# Patient Record
Sex: Male | Born: 1995 | Race: Black or African American | Hispanic: No | Marital: Single | Smoking: Never smoker
Health system: Southern US, Community
[De-identification: ages and names within clinical notes are randomized; demographics above are authoritative.]

## PROBLEM LIST (undated history)

## (undated) HISTORY — PX: OTHER SURGICAL HISTORY: SHX169

## (undated) HISTORY — PX: BRAIN SURGERY: SHX531

---
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
## 2017-03-28 ENCOUNTER — Encounter (HOSPITAL_COMMUNITY): Payer: Self-pay | Admitting: Emergency Medicine

## 2017-03-28 ENCOUNTER — Emergency Department (HOSPITAL_COMMUNITY)
Admission: EM | Admit: 2017-03-28 | Discharge: 2017-03-28 | Discharge status: Against Medical Advice | Payer: Self-pay | Attending: Emergency Medicine | Admitting: Emergency Medicine

## 2017-03-28 DIAGNOSIS — S0990XA Unspecified injury of head, initial encounter: Secondary | ICD-10-CM | POA: Insufficient documentation

## 2017-03-28 DIAGNOSIS — Z5321 Procedure and treatment not carried out due to patient leaving prior to being seen by health care provider: Secondary | ICD-10-CM | POA: Insufficient documentation

## 2017-03-28 DIAGNOSIS — Y939 Activity, unspecified: Secondary | ICD-10-CM | POA: Insufficient documentation

## 2017-03-28 DIAGNOSIS — Y999 Unspecified external cause status: Secondary | ICD-10-CM | POA: Insufficient documentation

## 2017-03-28 DIAGNOSIS — Y929 Unspecified place or not applicable: Secondary | ICD-10-CM | POA: Insufficient documentation

## 2017-03-28 NOTE — ED Triage Notes (Signed)
Pt c/o mouth pain. Pt's mouth is bleeding uncontrolled. Pt has lac to the left forehead. Pt unknown if there was any loc.Pt has retainer

## 2017-03-28 NOTE — ED Notes (Signed)
Pt's mother states that she is going somewhere else due to having to wait

## 2018-05-15 ENCOUNTER — Emergency Department (HOSPITAL_COMMUNITY)
Admission: EM | Admit: 2018-05-15 | Discharge: 2018-05-15 | Disposition: A | Discharge status: Home / Self Care | Payer: Self-pay | Attending: Emergency Medicine | Admitting: Emergency Medicine

## 2018-05-15 ENCOUNTER — Other Ambulatory Visit: Payer: Self-pay

## 2018-05-15 ENCOUNTER — Encounter (HOSPITAL_COMMUNITY): Payer: Self-pay | Admitting: Emergency Medicine

## 2018-05-15 DIAGNOSIS — L0201 Cutaneous abscess of face: Secondary | ICD-10-CM | POA: Insufficient documentation

## 2018-05-15 DIAGNOSIS — L0291 Cutaneous abscess, unspecified: Secondary | ICD-10-CM

## 2018-05-15 MED ORDER — SULFAMETHOXAZOLE-TRIMETHOPRIM 800-160 MG PO TABS: 1.0000 | ORAL_TABLET | Freq: Two times a day (BID) | ORAL | 0 refills | Status: AC

## 2018-05-15 MED ORDER — SULFAMETHOXAZOLE-TRIMETHOPRIM 800-160 MG PO TABS: 1.0000 | ORAL_TABLET | Freq: Two times a day (BID) | ORAL | 0 refills | Status: DC

## 2018-05-15 NOTE — ED Provider Notes (Signed)
[facility] EMERGENCY DEPARTMENT Provider Note   CSN: [id number] Arrival date & time: 05/15/18  1108     History   Chief Complaint Chief Complaint  Patient presents with  . Abscess    HPI Corey Lozano is a 22 y.o. male.  HPI   22 year old male with an abscess underneath the chin inferior.  First noticed a small "pimple" on Sunday.  Progressively getting bigger since then.  He has been squeezing it and has gotten some "pus" out of it.  He would be checked because it is not completely gotten better and is also concerned that it may potentially be a spider bite.  Denies any dental pain.  No fevers or chills.  History reviewed. No pertinent past medical history.  There are no active problems to display for this patient.   Past Surgical History:  Procedure Laterality Date  . BRAIN SURGERY          Home Medications    Prior to Admission medications   Medication Sig Start Date End Date Taking? Authorizing Provider  sulfamethoxazole-trimethoprim (BACTRIM DS,SEPTRA DS) 800-160 MG tablet Take 1 tablet by mouth 2 (two) times daily for 7 days. 05/15/18 05/22/18  Virgel Manifold, MD    Family History No family history on file.  Social History Social History   Tobacco Use  . Smoking status: Never Smoker  . Smokeless tobacco: Never Used  Substance Use Topics  . Alcohol use: No  . Drug use: No     Allergies   Patient has no known allergies.   Review of Systems Review of Systems  All systems reviewed and negative, other than as noted in HPI.  Physical Exam Updated Vital Signs BP 123/82 (BP Location: Right Arm)   Pulse 92   Temp 97.8 F (36.6 C) (Oral)   Resp 16   Ht 5\' 5"  (1.651 m)   Wt 54.4 kg   SpO2 100%   BMI 19.97 kg/m   Physical Exam  Constitutional: He appears well-developed and well-nourished. No distress.  HENT:  Head: Normocephalic.  Eyes: Conjunctivae are normal. Right eye exhibits no discharge. Left eye exhibits no discharge.  Neck:  Neck supple.    Just below the surgical scar underneath his chin there is a small ulcerated lesion less than size of a dime consistent with a healing abscess.  There is no fluctuance.  No drainage.  Minimal induration.  No cellulitis.  His dentition is intact.  He has no trismus.  Sublingual tissues are soft.  Normal sounding voice.  Cardiovascular: Normal rate, regular rhythm and normal heart sounds. Exam reveals no gallop and no friction rub.  No murmur heard. Pulmonary/Chest: Effort normal and breath sounds normal. No respiratory distress.  Abdominal: Soft. He exhibits no distension. There is no tenderness.  Musculoskeletal: He exhibits no edema or tenderness.  Neurological: He is alert.  Skin: Skin is warm and dry.  Psychiatric: He has a normal mood and affect. His behavior is normal. Thought content normal.  Nursing note and vitals reviewed.    ED Treatments / Results  Labs (all labs ordered are listed, but only abnormal results are displayed) Labs Reviewed - No data to display  EKG None  Radiology No results found.  Procedures Procedures (including critical care time)  Medications Ordered in ED Medications - No data to display   Initial Impression / Assessment and Plan / ED Course  I have reviewed the triage vital signs and the nursing notes.  Pertinent labs & imaging results  that were available during my care of the patient were reviewed by me and considered in my medical decision making (see chart for details).     22 year old male with a small abscess beneath his chin.  No organized collection at this time to I&D.  There is no stranding cellulitis.  I do not think that this is odontologic in origin. Plan warm compresses. Bactrim. Continued wound care and return precautions discussed.   Final Clinical Impressions(s) / ED Diagnoses   Final diagnoses:  Abscess    ED Discharge Orders         Ordered    sulfamethoxazole-trimethoprim (BACTRIM DS,SEPTRA DS)  800-160 MG tablet  2 times daily,   Status:  Discontinued     05/15/18 1204    sulfamethoxazole-trimethoprim (BACTRIM DS,SEPTRA DS) 800-160 MG tablet  2 times daily     05/15/18 1206           Virgel Manifold, MD 05/15/18 1215

## 2018-05-15 NOTE — ED Triage Notes (Signed)
Poss abscess under chin, has made pt's neck swell

## 2018-05-15 NOTE — Discharge Instructions (Signed)
Use warm compresses 5-10 minutes 3-4 times a day for the next few days. Otherwise try to minimize irritation to the area. Take 600 mg of ibuprofen every 6 hours as needed for pain.

## 2019-08-13 ENCOUNTER — Other Ambulatory Visit: Payer: Self-pay

## 2019-08-13 ENCOUNTER — Emergency Department (HOSPITAL_COMMUNITY): Payer: Self-pay

## 2019-08-13 ENCOUNTER — Encounter (HOSPITAL_COMMUNITY): Payer: Self-pay

## 2019-08-13 ENCOUNTER — Emergency Department (HOSPITAL_COMMUNITY)
Admission: EM | Admit: 2019-08-13 | Discharge: 2019-08-13 | Disposition: A | Discharge status: Home / Self Care | Payer: Self-pay | Attending: Emergency Medicine | Admitting: Emergency Medicine

## 2019-08-13 DIAGNOSIS — R6884 Jaw pain: Secondary | ICD-10-CM | POA: Insufficient documentation

## 2019-08-13 DIAGNOSIS — G8929 Other chronic pain: Secondary | ICD-10-CM | POA: Insufficient documentation

## 2019-08-13 MED ORDER — IBUPROFEN 800 MG PO TABS: 800.0000 mg | ORAL_TABLET | Freq: Three times a day (TID) | ORAL | 0 refills | Status: AC

## 2019-08-13 MED ORDER — IBUPROFEN 800 MG PO TABS
800.0000 mg | ORAL_TABLET | Freq: Once | ORAL | Status: AC
  Administered 2019-08-13: 800 mg via ORAL
  Filled 2019-08-13: qty 1

## 2019-08-13 NOTE — Discharge Instructions (Addendum)
Your xrays today are normal with no obvious reason for your pain.  I recommend increasing your ibuprofen (advil) to 4 tablets every 8 hours if needed for pain.  This is the maximum safe dose to take but should help with your symptoms.  The group listed below is who did your surgery.  Call for a recheck if your symptoms persist.   Plastic & Reconstructive Surgery - [facility] [place], [place] [address]   [phone]

## 2019-08-13 NOTE — ED Provider Notes (Signed)
[facility] EMERGENCY DEPARTMENT Provider Note   CSN: [id number] Arrival date & time: 08/13/19  [id number]     History   Chief Complaint Chief Complaint  Patient presents with  . Jaw Pain    HPI Corey Lozano is a 23 y.o. male with a history of jaw surgery secondary to fracture in 2018 [facility]) presenting with right jaw joint pain which he experienced last winter as well when the weather gets cold.  He describes chronic aching pain which worsens with chewing, movement and simply talking. He denies fevers, chills, swelling, denies any dental trauma, decay or pain and no ear pain or reduced hearing acuity.  He has taken ibuprofen 200 mg with no improvement in pain.  He does not an occasional clicking sound with chewing.  He does not wake with tooth or jaw pain.     The history is provided by the patient.    History reviewed. No pertinent past medical history.  There are no active problems to display for this patient.   Past Surgical History:  Procedure Laterality Date  . BRAIN SURGERY    . closed fracture of the symphysis of the mandible    . jaw sx          Home Medications    Prior to Admission medications   Medication Sig Start Date End Date Taking? Authorizing Provider  ibuprofen (ADVIL) 800 MG tablet Take 1 tablet (800 mg total) by mouth 3 (three) times daily. 08/13/19   Evalee Jefferson, PA-C    Family History No family history on file.  Social History Social History   Tobacco Use  . Smoking status: Never Smoker  . Smokeless tobacco: Never Used  Substance Use Topics  . Alcohol use: No    Comment: occ  . Drug use: Yes    Types: Marijuana     Allergies   Patient has no known allergies.   Review of Systems Review of Systems  Constitutional: Negative for chills and fever.  HENT: Negative for dental problem, ear pain, facial swelling, sinus pain, sore throat and trouble swallowing.        Negative except as mentioned in HPI.  Respiratory: Negative for  shortness of breath.   Cardiovascular: Negative for chest pain.  Gastrointestinal: Negative.   Musculoskeletal: Negative for neck pain and neck stiffness.     Physical Exam Updated Vital Signs BP 119/70 (BP Location: Right Arm)   Pulse 71   Temp 98.6 F (37 C) (Oral)   Resp 16   SpO2 100%   Physical Exam Vitals signs and nursing note reviewed.  Constitutional:      Appearance: He is well-developed.  HENT:     Head: Normocephalic and atraumatic.     Jaw: There is normal jaw occlusion. Tenderness present. No trismus or swelling.      Mouth/Throat:     Mouth: Mucous membranes are moist.     Dentition: Normal dentition. No dental tenderness or gingival swelling.  Eyes:     Conjunctiva/sclera: Conjunctivae normal.  Neck:     Musculoskeletal: Normal range of motion.  Cardiovascular:     Rate and Rhythm: Normal rate and regular rhythm.  Pulmonary:     Effort: Pulmonary effort is normal.  Musculoskeletal: Normal range of motion.  Lymphadenopathy:     Comments: No head or neck adenopathy  Skin:    General: Skin is warm and dry.  Neurological:     Mental Status: He is alert.      ED  Treatments / Results  Labs (all labs ordered are listed, but only abnormal results are displayed) Labs Reviewed - No data to display  EKG None  Radiology Dg Mandible 4 Views  Result Date: 08/13/2019 CLINICAL DATA:  Trauma to the mandible 1 year ago, status postoperative fixation with increasing pain. EXAM: MANDIBLE - 4+ VIEW COMPARISON:  None. FINDINGS: Postoperative changes related to ORIF of the mandible along the anterior mandible without acute findings. IMPRESSION: Postoperative changes of plate and screw fixation of the mandible without acute findings. Electronically Signed   By: Zetta Bills M.D.   On: 08/13/2019 11:25    Procedures Procedures (including critical care time)  Medications Ordered in ED Medications  ibuprofen (ADVIL) tablet 800 mg (800 mg Oral Given 08/13/19  1240)     Initial Impression / Assessment and Plan / ED Course  I have reviewed the triage vital signs and the nursing notes.  Pertinent labs & imaging results that were available during my care of the patient were reviewed by me and considered in my medical decision making (see chart for details).        Imaging reviewed and discussed with patient.  No arthritis noted on his plain film images.  Dentition is without pain, decay, no gingival edema.  No appreciable crepitus with TM range of motion.  He was encouraged to follow-up with his surgeon at [facility] for further evaluation if his symptoms persist.  He was prescribed prescription strength ibuprofen for hopeful better pain relief.  As needed follow-up anticipated.  Final Clinical Impressions(s) / ED Diagnoses   Final diagnoses:  Chronic jaw pain    ED Discharge Orders         Ordered    ibuprofen (ADVIL) 800 MG tablet  3 times daily     08/13/19 1219           Evalee Jefferson, PA-C 08/13/19 1300    Milton Ferguson, MD 08/13/19 1430

## 2019-08-13 NOTE — ED Triage Notes (Signed)
Pt reports hx of fx jaw on right side last year and since cold weather increase pain and feels like it is pushing into head

## 2019-12-13 ENCOUNTER — Emergency Department (HOSPITAL_COMMUNITY): Payer: Self-pay

## 2019-12-13 ENCOUNTER — Emergency Department (HOSPITAL_COMMUNITY)
Admission: EM | Admit: 2019-12-13 | Discharge: 2019-12-14 | Disposition: A | Discharge status: Home / Self Care | Payer: Self-pay | Attending: Emergency Medicine | Admitting: Emergency Medicine

## 2019-12-13 ENCOUNTER — Other Ambulatory Visit: Payer: Self-pay

## 2019-12-13 DIAGNOSIS — F1210 Cannabis abuse, uncomplicated: Secondary | ICD-10-CM | POA: Insufficient documentation

## 2019-12-13 DIAGNOSIS — M79605 Pain in left leg: Secondary | ICD-10-CM | POA: Insufficient documentation

## 2019-12-13 DIAGNOSIS — D1622 Benign neoplasm of long bones of left lower limb: Secondary | ICD-10-CM | POA: Insufficient documentation

## 2019-12-13 MED ORDER — NAPROXEN 500 MG PO TABS: 500.0000 mg | ORAL_TABLET | Freq: Two times a day (BID) | ORAL | 0 refills | Status: AC | PRN

## 2019-12-13 NOTE — ED Provider Notes (Addendum)
[facility] EMERGENCY DEPARTMENT Provider Note   CSN: [id number] Arrival date & time: 12/13/19  2228     History Chief Complaint  Patient presents with  . Leg Pain    left    Corey Lozano is a 24 y.o. male.  Patient states having intermittent pain in his left knee and thigh for many years.  He states the pain comes and goes happening 2-3 times a week usually waking him from sleep.  He believes he had a broken bone in his femur when he was a child and was in a cast but is not certain if he had surgery.  He denies any recent fall or trauma.  States he had pain wake him from sleep tonight and had difficulty bending his knee so he wanted to get checked out.  He did not take any medication at home.  The pain is improved.  The pain is on his left lateral thigh and knee.  He is able to range his ankle and knee and hip without pain.  Denies any fevers, chills, nausea or vomiting.  Denies any leg swelling.  Denies any chest pain or shortness of breath. He has never had this pain evaluated in the past.  States his pain happens when he stands for long period of time and happens several times a week but usually goes away after a few hours.  States when the pain comes he ties a sock around his leg and it helps the pain. He was advised not to do this.  The history is provided by the patient.  Leg Pain Associated symptoms: no fever        No past medical history on file.  There are no problems to display for this patient.   Past Surgical History:  Procedure Laterality Date  . BRAIN SURGERY    . closed fracture of the symphysis of the mandible    . jaw sx         No family history on file.  Social History   Tobacco Use  . Smoking status: Never Smoker  . Smokeless tobacco: Never Used  Substance Use Topics  . Alcohol use: No    Comment: occ  . Drug use: Yes    Types: Marijuana    Home Medications Prior to Admission medications   Medication Sig Start Date End Date Taking?  Authorizing Provider  ibuprofen (ADVIL) 800 MG tablet Take 1 tablet (800 mg total) by mouth 3 (three) times daily. 08/13/19   Evalee Jefferson, PA-C    Allergies    Patient has no known allergies.  Review of Systems   Review of Systems  Constitutional: Negative for activity change, appetite change and fever.  HENT: Negative for congestion and rhinorrhea.   Eyes: Negative for visual disturbance.  Respiratory: Negative for cough, chest tightness and shortness of breath.   Cardiovascular: Negative for chest pain.  Gastrointestinal: Negative for abdominal pain, nausea and vomiting.  Genitourinary: Negative for dysuria and hematuria.  Musculoskeletal: Positive for arthralgias and myalgias.  Skin: Negative for rash.  Neurological: Negative for weakness and headaches.   all other systems are negative except as noted in the HPI and PMH.    Physical Exam Updated Vital Signs BP 112/72 (BP Location: Left Arm)   Pulse 76   Temp 98.8 F (37.1 C) (Oral)   Resp 18   Ht 5\' 5"  (1.651 m)   Wt 52.2 kg   SpO2 95%   BMI 19.14 kg/m   Physical Exam  Vitals and nursing note reviewed.  Constitutional:      General: He is not in acute distress.    Appearance: He is well-developed.  HENT:     Head: Normocephalic and atraumatic.     Mouth/Throat:     Pharynx: No oropharyngeal exudate.  Eyes:     Conjunctiva/sclera: Conjunctivae normal.     Pupils: Pupils are equal, round, and reactive to light.  Neck:     Comments: No meningismus. Cardiovascular:     Rate and Rhythm: Normal rate and regular rhythm.     Heart sounds: Normal heart sounds. No murmur.  Pulmonary:     Effort: Pulmonary effort is normal. No respiratory distress.     Breath sounds: Normal breath sounds.  Abdominal:     Palpations: Abdomen is soft.     Tenderness: There is no abdominal tenderness. There is no guarding or rebound.  Musculoskeletal:        General: No swelling, tenderness, deformity or signs of injury. Normal range of  motion.     Cervical back: Normal range of motion and neck supple.     Comments: Left leg is normal to inspection.  There is tenderness to the lateral distal thigh.  Full range of motion of knee joints.  No effusion.  Able to lift leg and keep knee extended. Intact DP and PT pulse.  Compartments soft.  No asymmetry. No edema.  Skin:    General: Skin is warm.  Neurological:     Mental Status: He is alert and oriented to person, place, and time.     Cranial Nerves: No cranial nerve deficit.     Motor: No abnormal muscle tone.     Coordination: Coordination normal.     Comments: No ataxia on finger to nose bilaterally. No pronator drift. 5/5 strength throughout. CN 2-12 intact.Equal grip strength. Sensation intact.   Psychiatric:        Behavior: Behavior normal.     ED Results / Procedures / Treatments   Labs (all labs ordered are listed, but only abnormal results are displayed) Labs Reviewed - No data to display  EKG None  Radiology DG Knee Complete 4 Views Left  Result Date: 12/13/2019 CLINICAL DATA:  Left leg pain EXAM: LEFT KNEE - COMPLETE 4+ VIEW COMPARISON:  None. FINDINGS: No acute bony abnormality. Specifically, no fracture, subluxation, or dislocation. No joint effusion. Joint spaces maintained. Osteochondroma off the distal lateral femur. IMPRESSION: No acute bony abnormality. Electronically Signed   By: Rolm Baptise M.D.   On: 12/13/2019 23:47   DG Hip Unilat W or Wo Pelvis 2-3 Views Left  Result Date: 12/13/2019 CLINICAL DATA:  Left leg pain EXAM: DG HIP (WITH OR WITHOUT PELVIS) 2-3V LEFT COMPARISON:  None. FINDINGS: There is no evidence of hip fracture or dislocation. There is no evidence of arthropathy or other focal bone abnormality. IMPRESSION: Negative. Electronically Signed   By: Rolm Baptise M.D.   On: 12/13/2019 23:47    Procedures Procedures (including critical care time)  Medications Ordered in ED Medications - No data to display  ED Course  I have  reviewed the triage vital signs and the nursing notes.  Pertinent labs & imaging results that were available during my care of the patient were reviewed by me and considered in my medical decision making (see chart for details).    MDM Rules/Calculators/A&P                     Intermittent  leg pain with possible history of remote fracture.  Neurovascularly intact. No evidence of infection.  No asymmetry or calf tenderness to suggest DVT.  Obtain x-ray to evaluate for potential hardware malfunction.  Doubt DVT. X-rays negative for fracture or dislocation. No evidence of previous surgery.  Does show osteochondroma of distal femur which correlates with patient's area of pain.  Advised ice, NSAIDs, orthopedic follow-up.  Return precautions discussed. Final Clinical Impression(s) / ED Diagnoses Final diagnoses:  Left leg pain  Osteochondroma of femur, left    Rx / DC Orders ED Discharge Orders    None       Brandee Markin, Annie Main, MD 12/13/19 Minus Breeding    Ezequiel Essex, MD 12/14/19 0006

## 2019-12-13 NOTE — ED Triage Notes (Signed)
Patient states he is having left leg pain in the thigh area. Patient denies any recent injury. Patient states that he had a broken left femur when he was a child and sometimes he has pain in the left leg.

## 2019-12-13 NOTE — Discharge Instructions (Signed)
Your x-ray shows no fracture or evidence of previous surgery.  It does show a growth of the bone called an osteochondroma which is usually benign but you should follow-up with the orthopedic doctor for further assessment of this lesion.  You may use ice and anti-inflammatories.  Return to the ED with worsening pain, numbness, tingling, or other concerns.

## 2019-12-30 ENCOUNTER — Ambulatory Visit (INDEPENDENT_AMBULATORY_CARE_PROVIDER_SITE_OTHER): Payer: Self-pay | Admitting: Orthopedic Surgery

## 2019-12-30 ENCOUNTER — Other Ambulatory Visit: Payer: Self-pay

## 2019-12-30 ENCOUNTER — Encounter: Payer: Self-pay | Admitting: Orthopedic Surgery

## 2019-12-30 VITALS — BP 111/70 | HR 68 | Temp 98.1°F | Ht 65.0 in | Wt 115.0 lb

## 2019-12-30 DIAGNOSIS — M899 Disorder of bone, unspecified: Secondary | ICD-10-CM

## 2019-12-30 DIAGNOSIS — D1622 Benign neoplasm of long bones of left lower limb: Secondary | ICD-10-CM

## 2019-12-30 NOTE — Progress Notes (Signed)
Chief Complaint  Patient presents with  . Leg Pain    Left leg pain.    24 year old male went to the ER because of longstanding left knee pain and aching x-ray showed a possible osteochondroma.  He presents here for management  He says he had trouble with the leg off-and-on with aching pain he says now it locks and clicks  He did have a femur fracture as a child however x-rays of the hip and femur and knee show that the femur fracture healed nicely  Review of systems is completely negative no fever no chills no masses no weight loss  No past medical history on file. Past Surgical History:  Procedure Laterality Date  . BRAIN SURGERY    . closed fracture of the symphysis of the mandible    . jaw sx     Social History   Tobacco Use  . Smoking status: Never Smoker  . Smokeless tobacco: Never Used  Substance Use Topics  . Alcohol use: No    Comment: occ  . Drug use: Yes    Types: Marijuana   BP 111/70   Pulse 68   Temp 98.1 F (36.7 C)   Ht 5\' 5"  (1.651 m)   Wt 115 lb (52.2 kg)   BMI 19.14 kg/m   Well-developed well-nourished male neurovascular intact gait is normal oriented x3 small frame ectomorphic body habitus  Upper extremities are normal  Right lower extremity has a palpable mass lateral side of his lower leg above the knee joint does not affect his range of motion and stability and strength there is no atrophy skin is normal pulses are good lymph nodes are negative sensation is intact no pathologic reflexes coordination and balance are normal  X-ray from the hospital.  Hip and pelvis including the left show no fracture dislocation or bone lesion.  Looks like his femur healed nicely  His knee x-ray looks normal except for old spicule shaped mass with connection to the intramedullary canal several centimeters above the joint line which may be an osteochondroma  Recommend CAT scan to evaluate.  If the CAT scan shows a true osteochondroma we can remove it if not  something more ominous form to them that we will send to cancer specialist  Encounter Diagnoses  Name Primary?  . Lytic bone lesion of left femur Yes  . Osteochondroma of femur, left

## 2019-12-30 NOTE — Patient Instructions (Signed)
CT scan will be ordered.  He will come back here for follow-up visit to review it and possibly schedule surgery

## 2020-01-21 ENCOUNTER — Ambulatory Visit (HOSPITAL_COMMUNITY): Admission: RE | Admit: 2020-01-21 | Payer: Medicaid Other | Source: Ambulatory Visit

## 2020-02-03 ENCOUNTER — Ambulatory Visit (HOSPITAL_COMMUNITY): Admission: RE | Admit: 2020-02-03 | Payer: Self-pay | Source: Ambulatory Visit

## 2020-02-04 ENCOUNTER — Telehealth: Payer: Self-pay | Admitting: Radiology

## 2020-02-04 NOTE — Telephone Encounter (Signed)
Patient no showed for his CT scan yesterday, I called him he states there was a death in the family, I have given him the number to Rs and urged him to get the scan, since he has a bone lesion, he said he is not having any pain, I told him he really needs scan to find out what this is, he has voiced understanding and will call to [facility]

## 2020-02-09 ENCOUNTER — Telehealth: Payer: Self-pay | Admitting: Radiology

## 2020-02-09 ENCOUNTER — Encounter: Payer: Self-pay | Admitting: Orthopedic Surgery

## 2020-02-09 NOTE — Telephone Encounter (Signed)
Tell Corey Lozano to send him a certified letter that says the following:  You have a lesion in the bone of your thigh.  We are not sure what this lesion is.  It could be cancerous.  We strongly urged that you schedule an MRI to diagnose the lesion.

## 2020-02-09 NOTE — Telephone Encounter (Signed)
Patient no showed for MRI, I called him he states there was a death in the family, I have given him the number to Rs and urged him to get the scan, since he has a bone lesion, he said he is not having any pain, I told him he really needs scan to find out what this is, he has voiced understanding and will call to RS   Patient has declined to call and RS you were concerned about a lesion in his femur that showed up on xray  I will close the MRI order, since he has declined the study, but want you to be aware

## 2020-02-09 NOTE — Telephone Encounter (Signed)
Letter has been done per Dr Aline Brochure. Routing to clinic supervisor for certified mailing.

## 2020-02-10 NOTE — Telephone Encounter (Signed)
Printed, will send via certified mail to patient.

## 2020-02-11 NOTE — Telephone Encounter (Signed)
Letter sent via certified mail today.

## 2020-03-03 ENCOUNTER — Encounter (HOSPITAL_COMMUNITY): Payer: Self-pay | Admitting: Emergency Medicine

## 2020-03-03 ENCOUNTER — Emergency Department (HOSPITAL_COMMUNITY)
Admission: EM | Admit: 2020-03-03 | Discharge: 2020-03-03 | Disposition: A | Discharge status: Home / Self Care | Payer: Medicaid Other | Attending: Emergency Medicine | Admitting: Emergency Medicine

## 2020-03-03 DIAGNOSIS — R531 Weakness: Secondary | ICD-10-CM | POA: Insufficient documentation

## 2020-03-03 DIAGNOSIS — Z5321 Procedure and treatment not carried out due to patient leaving prior to being seen by health care provider: Secondary | ICD-10-CM | POA: Insufficient documentation

## 2020-03-03 DIAGNOSIS — R0789 Other chest pain: Secondary | ICD-10-CM | POA: Insufficient documentation

## 2020-03-03 DIAGNOSIS — R55 Syncope and collapse: Secondary | ICD-10-CM | POA: Insufficient documentation

## 2020-03-03 LAB — CBC
HCT: 42.4 % (ref 39.0–52.0)
Hemoglobin: 14.8 g/dL (ref 13.0–17.0)
MCH: 30.1 pg (ref 26.0–34.0)
MCHC: 34.9 g/dL (ref 30.0–36.0)
MCV: 86.4 fL (ref 80.0–100.0)
Platelets: 212 10*3/uL (ref 150–400)
RBC: 4.91 MIL/uL (ref 4.22–5.81)
RDW: 11.3 % — ABNORMAL LOW (ref 11.5–15.5)
WBC: 4.9 10*3/uL (ref 4.0–10.5)
nRBC: 0 % (ref 0.0–0.2)

## 2020-03-03 LAB — BASIC METABOLIC PANEL
Anion gap: 11 (ref 5–15)
BUN: 7 mg/dL (ref 6–20)
CO2: 24 mmol/L (ref 22–32)
Calcium: 9.5 mg/dL (ref 8.9–10.3)
Chloride: 102 mmol/L (ref 98–111)
Creatinine, Ser: 1.06 mg/dL (ref 0.61–1.24)
GFR calc Af Amer: >60 mL/min (ref >60)
GFR calc non Af Amer: >60 mL/min (ref >60)
Glucose, Bld: 87 mg/dL (ref 70–99)
Potassium: 3.4 mmol/L — ABNORMAL LOW (ref 3.5–5.1)
Sodium: 137 mmol/L (ref 135–145)

## 2020-03-03 MED ORDER — SODIUM CHLORIDE 0.9% FLUSH: 3.0000 mL | Freq: Once | INTRAVENOUS | Status: DC

## 2020-03-03 NOTE — ED Triage Notes (Signed)
Pt reports while at work today started feeling generalized weakness with near syncope when standing from sitting position. Pt reports intermittent L sided chest pain with movement.

## 2020-03-03 NOTE — ED Notes (Signed)
Registration made this NT aware that pt left.

## 2020-05-16 IMAGING — DX DG MANDIBLE 4+V
4 series · 4 of 4 positions shown · non-contrast
Comparison: None.

CLINICAL DATA: Trauma to the mandible 1 year ago, status
postoperative fixation with increasing pain.

EXAM:
MANDIBLE - 4+ VIEW

[mandible pa]
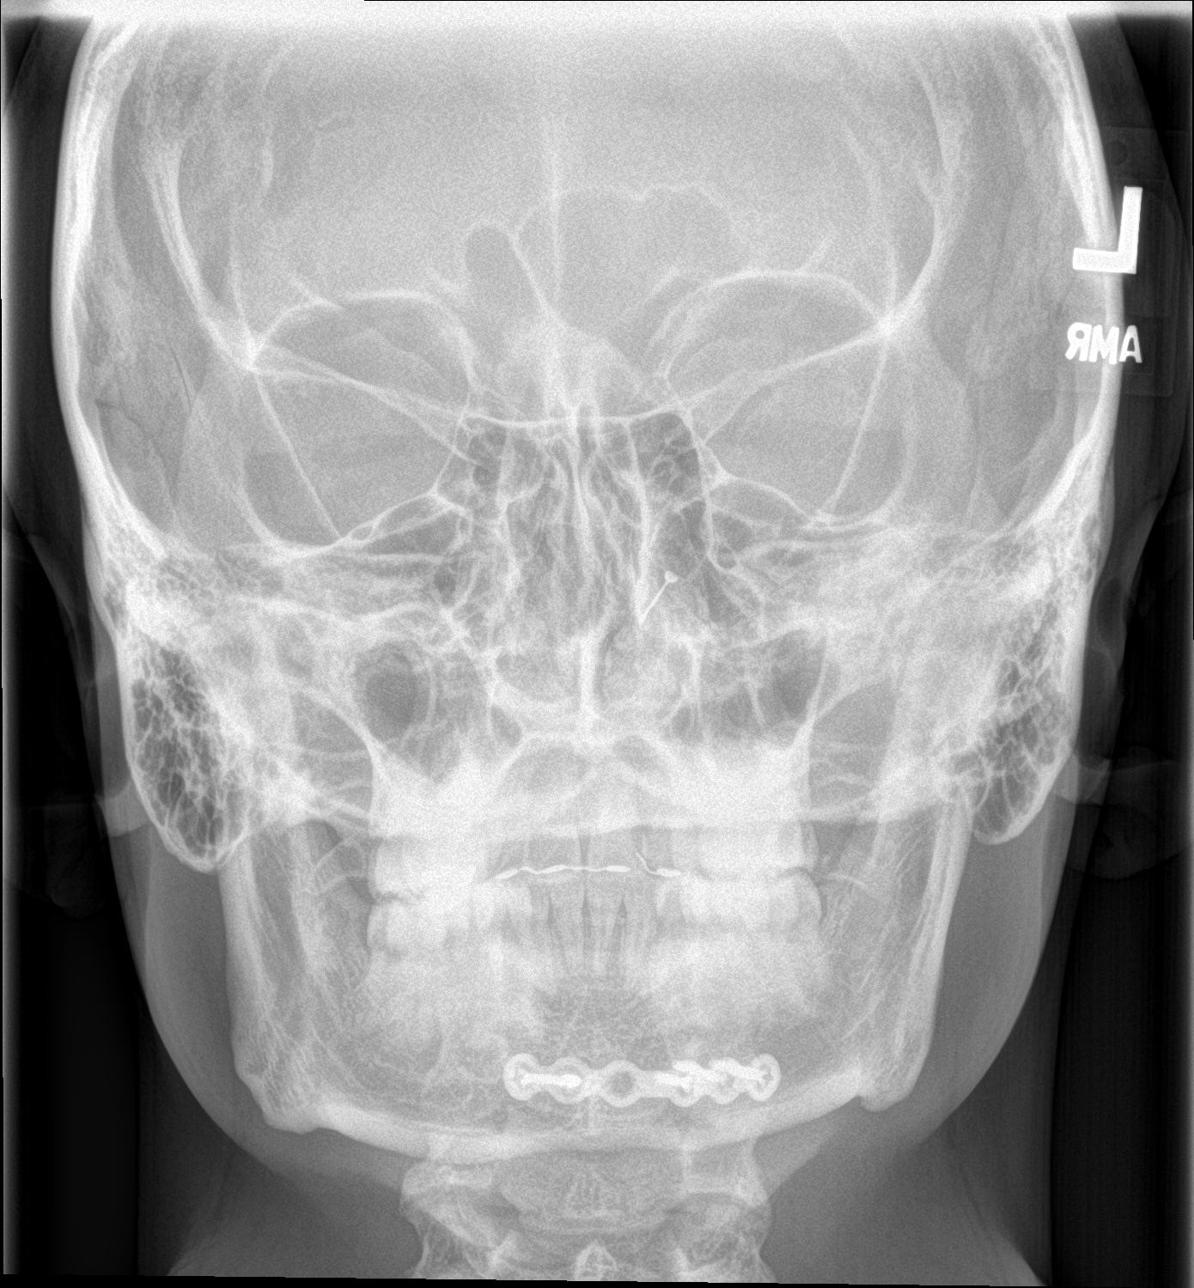

[mandible townes]
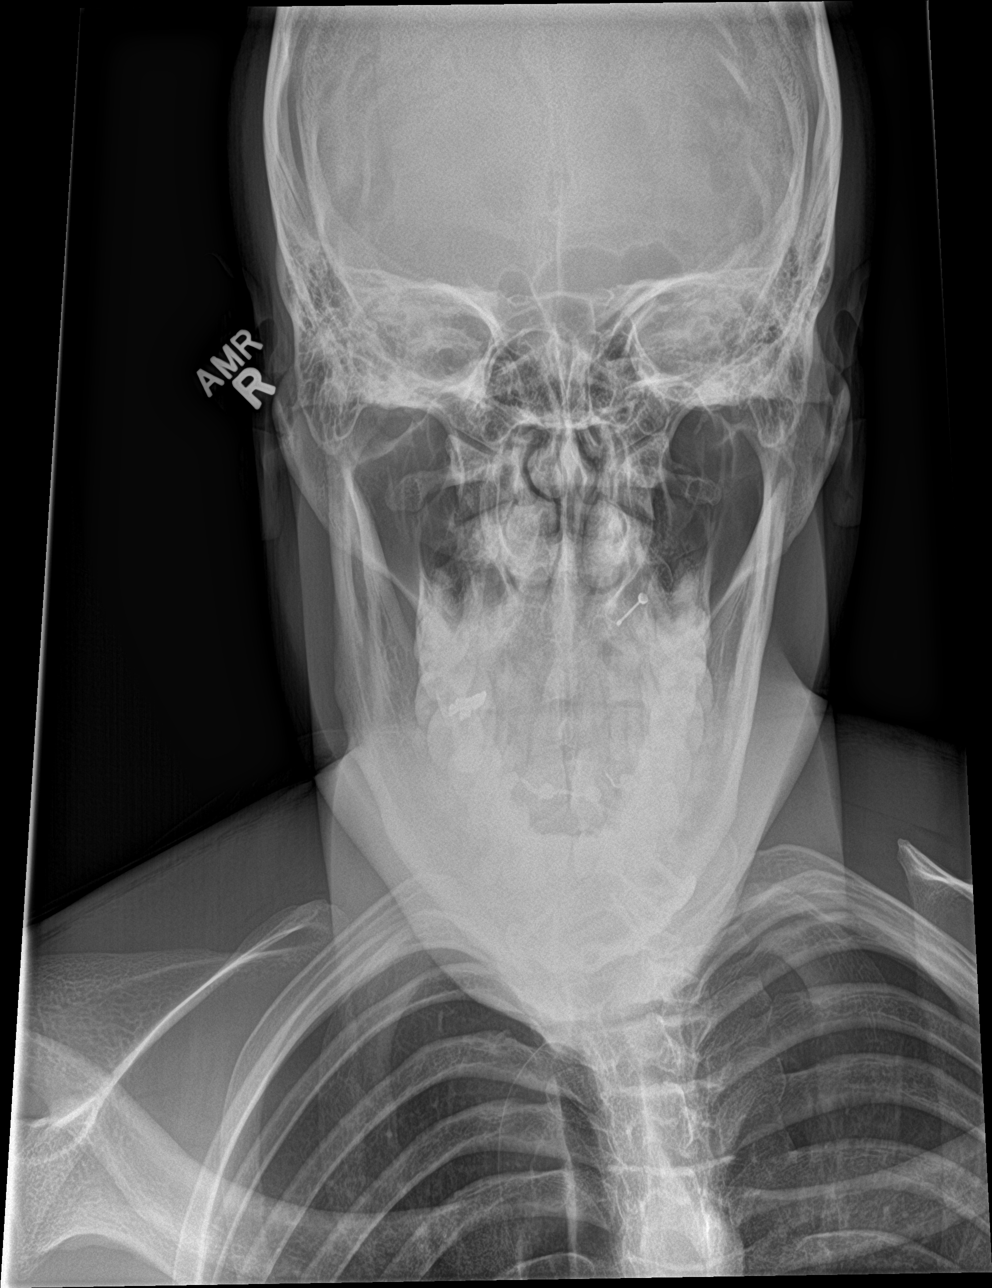

[mandible lat (1 of 2)]
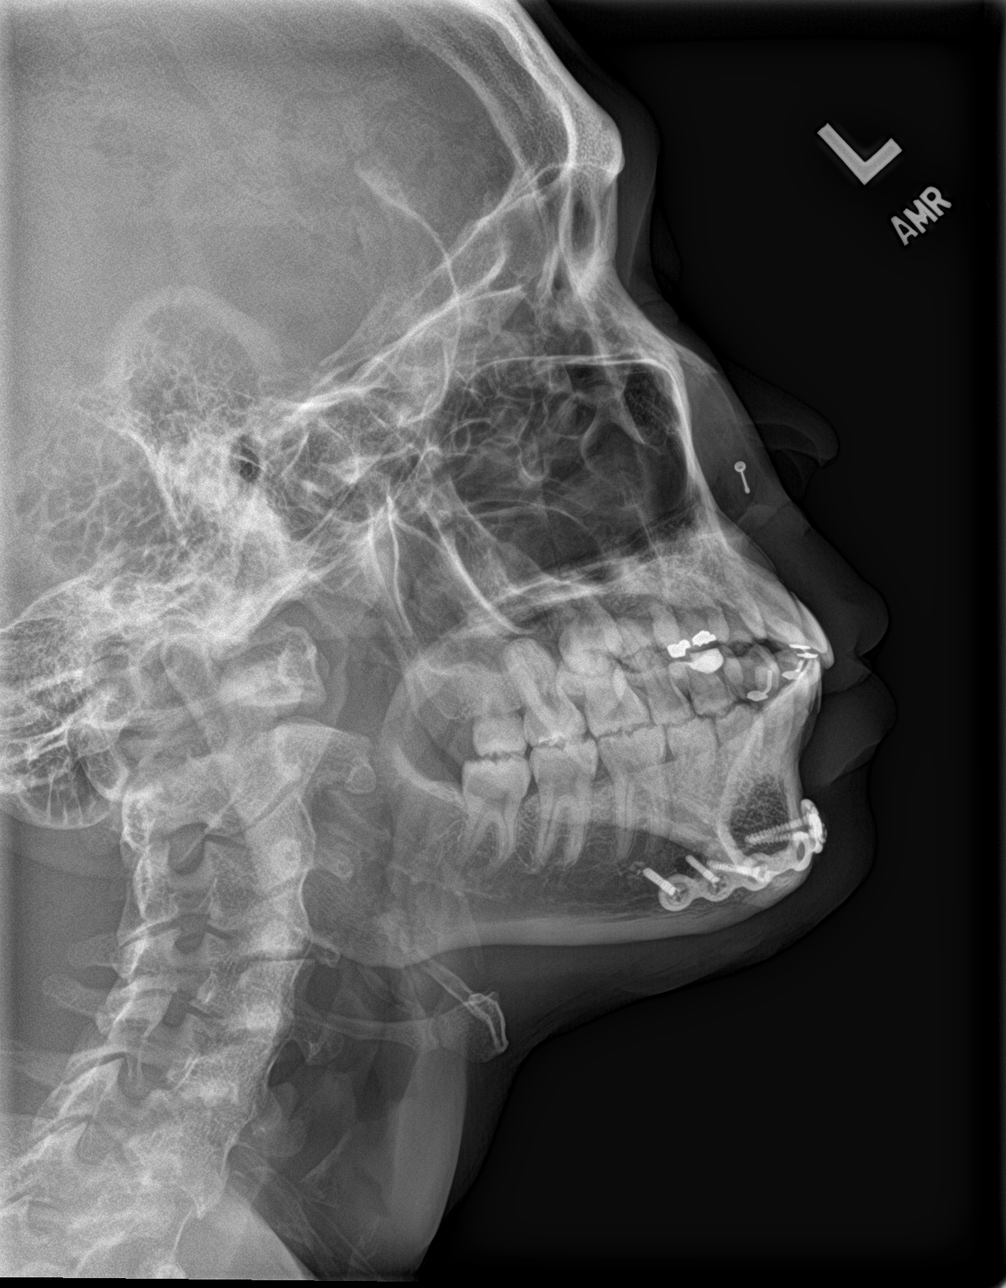

[mandible lat (2 of 2)]
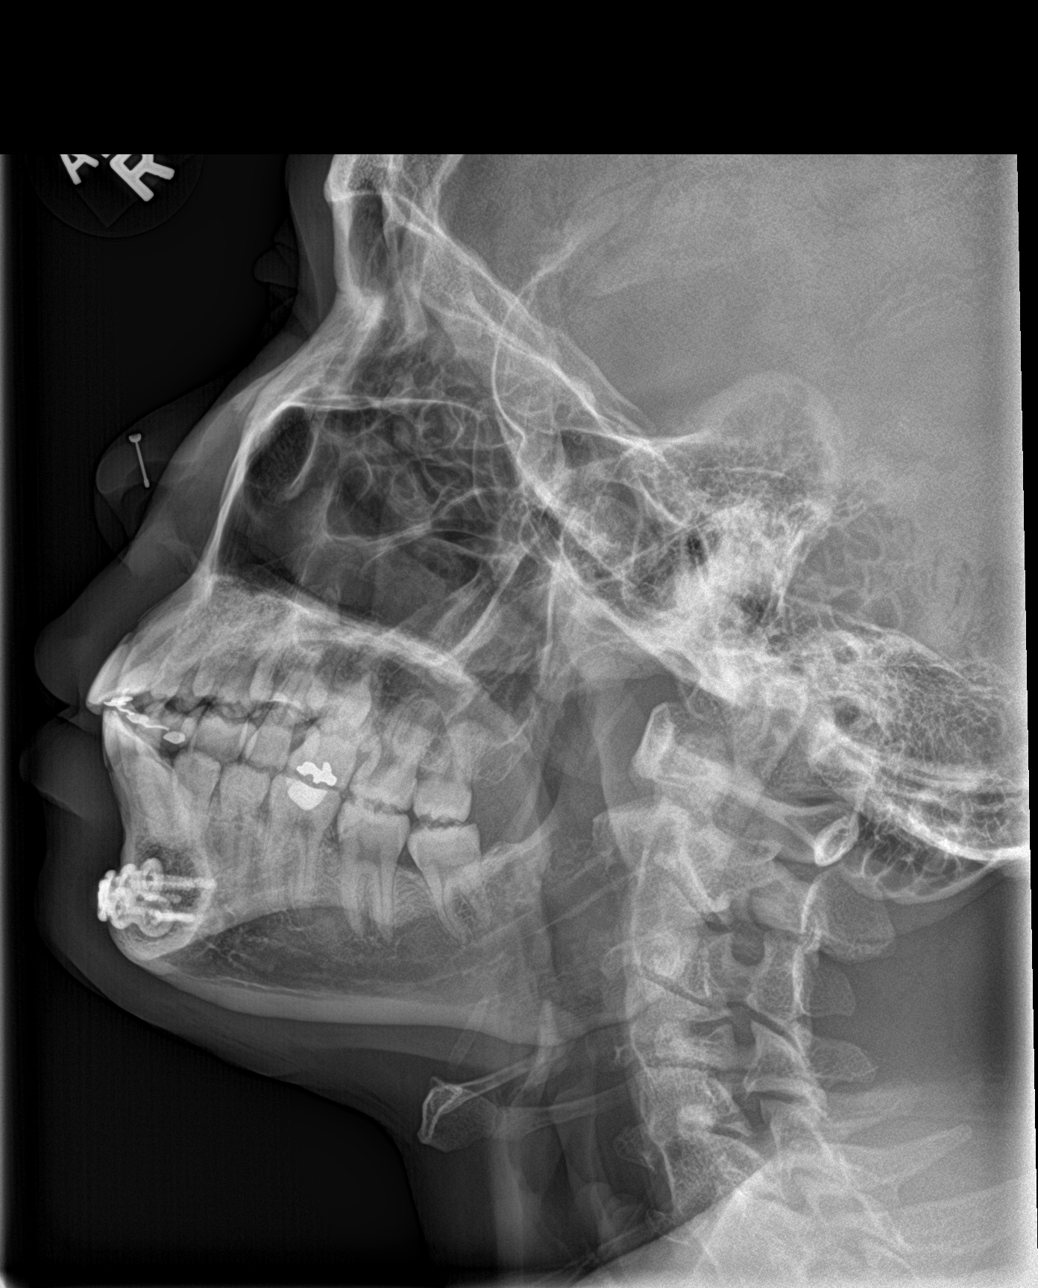

[4 of 4 positions shown; findings below may reference images not displayed]

FINDINGS: Postoperative changes related to ORIF of the mandible along the
anterior mandible without acute findings.
IMPRESSION: Postoperative changes of plate and screw fixation of the mandible
without acute findings.

## 2022-11-07 ENCOUNTER — Emergency Department (HOSPITAL_COMMUNITY)
Admission: EM | Admit: 2022-11-07 | Discharge: 2022-11-07 | Disposition: A | Discharge status: Home / Self Care | Payer: Self-pay | Attending: Emergency Medicine | Admitting: Emergency Medicine

## 2022-11-07 ENCOUNTER — Emergency Department (HOSPITAL_COMMUNITY): Payer: Self-pay

## 2022-11-07 DIAGNOSIS — H538 Other visual disturbances: Secondary | ICD-10-CM | POA: Insufficient documentation

## 2022-11-07 LAB — CBC WITH DIFFERENTIAL/PLATELET
Abs Immature Granulocytes: 0.02 10*3/uL (ref 0.00–0.07)
Basophils Absolute: 0 10*3/uL (ref 0.0–0.1)
Basophils Relative: 0 %
Eosinophils Absolute: 0.1 10*3/uL (ref 0.0–0.5)
Eosinophils Relative: 1 %
HCT: 40.3 % (ref 39.0–52.0)
Hemoglobin: 13.7 g/dL (ref 13.0–17.0)
Immature Granulocytes: 0 %
Lymphocytes Relative: 15 %
Lymphs Abs: 1.2 10*3/uL (ref 0.7–4.0)
MCH: 29.9 pg (ref 26.0–34.0)
MCHC: 34 g/dL (ref 30.0–36.0)
MCV: 88 fL (ref 80.0–100.0)
Monocytes Absolute: 0.3 10*3/uL (ref 0.1–1.0)
Monocytes Relative: 3 %
Neutro Abs: 6.2 10*3/uL (ref 1.7–7.7)
Neutrophils Relative %: 81 %
Platelets: 229 10*3/uL (ref 150–400)
RBC: 4.58 MIL/uL (ref 4.22–5.81)
RDW: 11.6 % (ref 11.5–15.5)
WBC: 7.7 10*3/uL (ref 4.0–10.5)
nRBC: 0 % (ref 0.0–0.2)

## 2022-11-07 LAB — BASIC METABOLIC PANEL
Anion gap: 9 (ref 5–15)
BUN: 11 mg/dL (ref 6–20)
CO2: 25 mmol/L (ref 22–32)
Calcium: 9.4 mg/dL (ref 8.9–10.3)
Chloride: 102 mmol/L (ref 98–111)
Creatinine, Ser: 0.79 mg/dL (ref 0.61–1.24)
GFR, Estimated: >60 mL/min (ref >60)
Glucose, Bld: 94 mg/dL (ref 70–99)
Potassium: 3.7 mmol/L (ref 3.5–5.1)
Sodium: 136 mmol/L (ref 135–145)

## 2022-11-07 NOTE — ED Notes (Signed)
Pt states that he was at work went vision went blurry for 45 minutes.  Pt stated that at that time he had a headache that started in the right side of his face then shifted to the left side.  Pt states that he currently has no headache and no blurry vision.  Pt reports he has never worn glasses but did have his "jaw broken 4 years ago."

## 2022-11-07 NOTE — ED Provider Triage Note (Signed)
Emergency Medicine Provider Triage Evaluation Note  Ivaan Silerio , a 27 y.o. male  was evaluated in triage.  Pt complains of bilateral blurry vision at 8 AM this morning, lasted for 45 minutes and resolved.  Associated now with a left-sided headache but no continued diplopia or blurry vision.  Denies any lateralized weakness..  Review of Systems  Per hpi   Physical Exam  BP 127/70   Pulse 79   Temp 98.2 F (36.8 C)   Resp 20   SpO2 99%  Gen:   Awake, no distress   Resp:  Normal effort  MSK:   Moves extremities without difficulty  Other:  Cranial nerves II through gross intact upper and lower extremity strength symmetric bilaterally.  Tired  Medical Decision Making  Medically screening exam initiated at 1:02 PM.  Appropriate orders placed.  Malakhi Seliger was informed that the remainder of the evaluation will be completed by another provider, this initial triage assessment does not replace that evaluation, and the importance of remaining in the ED until their evaluation is complete.     Sherrill Raring, PA-C 11/07/22 1303

## 2022-11-07 NOTE — ED Triage Notes (Addendum)
Patient here with complaint of sudden onset of blurred vision that started at 0830 while at work earlier today and lasted approximately 45 minutes. After onset, patient states he continued to work but the blurred vision did not resolve. No dysarthria, no facial droop, no unilateral weakness, patient is alert, oriented, ambulating independently with steady gait, and is in no apparent distress at this time. Patient reports having an energy drink (monster), a candy bar, and chips.

## 2022-11-07 NOTE — ED Provider Notes (Signed)
[facility] Provider Note   CSN: [id number] Arrival date & time: 11/07/22  1150     History  Chief Complaint  Patient presents with   Blurred Vision    Corey Lozano is a 27 y.o. male.  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  Symptoms lasted for about 2530 minutes now resolved.  No major medical problems.  Denies any nausea vomiting, weakness, numbness, speech changes.  Denies any visual loss.  Denies any other stroke symptoms.  Denies any alcohol or drugs.  No nausea vomiting abdominal pain chest pain.  The history is provided by the patient.       Home Medications Prior to Admission medications   Medication Sig Start Date End Date Taking? Authorizing Provider  ibuprofen (ADVIL) 800 MG tablet Take 1 tablet (800 mg total) by mouth 3 (three) times daily. Patient not taking: Reported on 12/30/2019 08/13/19   Evalee Jefferson, PA-C  naproxen (NAPROSYN) 500 MG tablet Take 1 tablet (500 mg total) by mouth 2 (two) times daily as needed. 12/13/19   Ezequiel Essex, MD      Allergies    Patient has no known allergies.    Review of Systems   Review of Systems  Physical Exam Updated Vital Signs BP 127/70   Pulse 79   Temp 98.2 F (36.8 C)   Resp 20   SpO2 99%  Physical Exam Vitals and nursing note reviewed.  Constitutional:      General: He is not in acute distress.    Appearance: He is well-developed. He is not ill-appearing.  HENT:     Head: Normocephalic and atraumatic.     Nose: Nose normal.     Mouth/Throat:     Mouth: Mucous membranes are moist.  Eyes:     Extraocular Movements: Extraocular movements intact.     Conjunctiva/sclera: Conjunctivae normal.     Pupils: Pupils are equal, round, and reactive to light.  Cardiovascular:     Rate and Rhythm: Normal rate and regular rhythm.      Pulses: Normal pulses.     Heart sounds: Normal heart sounds. No murmur heard. Pulmonary:     Effort: Pulmonary effort is normal. No respiratory distress.     Breath sounds: Normal breath sounds.  Abdominal:     Palpations: Abdomen is soft.     Tenderness: There is no abdominal tenderness.  Musculoskeletal:        General: No swelling.     Cervical back: Normal range of motion and neck supple.  Skin:    General: Skin is warm and dry.     Capillary Refill: Capillary refill takes less than 2 seconds.  Neurological:     General: No focal deficit present.     Mental Status: He is alert and oriented to person, place, and time.     Cranial Nerves: No cranial nerve deficit.     Sensory: No sensory deficit.     Motor: No weakness.     Coordination: Coordination normal.     Comments: 5+ out of 5 strength throughout, normal sensation, no drift, normal finger-nose-finger, normal speech, normal gait, normal visual fields  Psychiatric:        Mood and Affect: Mood normal.     ED Results / Procedures / Treatments   Labs (all labs ordered are listed, but only abnormal  results are displayed) Labs Reviewed  BASIC METABOLIC PANEL  CBC WITH DIFFERENTIAL/PLATELET    EKG None  Radiology CT Head Wo Contrast  Result Date: 11/07/2022 CLINICAL DATA:  Transient ischemic attack (TIA) EXAM: CT HEAD WITHOUT CONTRAST TECHNIQUE: Contiguous axial images were obtained from the base of the skull through the vertex without intravenous contrast. RADIATION DOSE REDUCTION: This exam was performed according to the departmental dose-optimization program which includes automated exposure control, adjustment of the mA and/or kV according to patient size and/or use of iterative reconstruction technique. COMPARISON:  None Available. FINDINGS: Brain: No evidence of acute large vascular territory infarction, hemorrhage, hydrocephalus, extra-axial collection or mass lesion/mass effect. Right anterior temporal lobe  hypoattenuation is favored to relate to streak artifact in this region. Vascular: No hyperdense vessel identified. Skull: No acute fracture. Sinuses/Orbits: Clear sinuses.  No acute orbital findings. Other: No mastoid effusions. IMPRESSION: No evidence of acute intracranial abnormality. Electronically Signed   By: Margaretha Sheffield M.D.   On: 11/07/2022 13:48    Procedures Procedures    Medications Ordered in ED Medications - No data to display  ED Course/ Medical Decision Making/ A&P                             Medical Decision Making  Corey Lozano is here with blurred vision, lightheadedness.  Symptoms resolved.  Normal vitals.  No fever.  Had bilateral blurred vision while at work doing some manual labor.  No weakness or numbness or speech changes or vision loss.  Neurologically he is intact.  He has no stroke risk factors.  Denies any chest pain or shortness of breath.  This occurred after drinking caffeine and not having much else to eat while doing some work.  Could be vertigo but this seems less likely to be stroke.  He appears very well.  CBC, BMP, CT head were ordered.  Per my review and interpretation lab work is unremarkable.  No significant anemia or electrolyte abnormality or kidney injury.  CT head per radiology reports unremarkable.  Overall patient appears well.  I suspect that this was a near syncopal type event/may be orthostatic event versus caffeine related event.  Understands return precautions.  Recommend follow-up with PCP.  This chart was dictated using voice recognition software.  Despite best efforts to proofread,  errors can occur which can change the documentation meaning.         Final Clinical Impression(s) / ED Diagnoses Final diagnoses:  Blurred vision, bilateral    Rx / DC Orders ED Discharge Orders     None         Lennice Sites, DO 11/07/22 1648

## 2022-11-22 ENCOUNTER — Encounter: Payer: Self-pay | Admitting: Radiology

## 2023-08-01 DIAGNOSIS — Z0184 Encounter for antibody response examination: Secondary | ICD-10-CM | POA: Diagnosis not present

## 2024-03-01 ENCOUNTER — Emergency Department (HOSPITAL_COMMUNITY)
Admission: EM | Admit: 2024-03-01 | Discharge: 2024-03-01 | Disposition: A | Discharge status: Home / Self Care | Payer: Self-pay | Attending: Emergency Medicine | Admitting: Emergency Medicine

## 2024-03-01 ENCOUNTER — Other Ambulatory Visit: Payer: Self-pay

## 2024-03-01 DIAGNOSIS — G5622 Lesion of ulnar nerve, left upper limb: Secondary | ICD-10-CM | POA: Insufficient documentation

## 2024-03-01 MED ORDER — PREDNISONE 10 MG PO TABS: 50.0000 mg | ORAL_TABLET | Freq: Every day | ORAL | 0 refills | Status: AC

## 2024-03-01 NOTE — ED Provider Notes (Signed)
 [place] EM[facility] DEPARTMENT AT [facility] Provider Note   CSN: [id number] Arrival date & time: 03/01/24  1211     History  No chief complaint on file.   Corey Lozano is a 28 y.o. male presenting with 1 week of left forearm pain and numbness left 4th and 5th finger.  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 No headache. Taking ibuprofen   HPI     Home Medications Prior to Admission medications   Medication Sig Start Date End Date Taking? Authorizing Provider  predniSONE (DELTASONE) 10 MG tablet Take 5 tablets (50 mg total) by mouth daily with breakfast for 5 days. 03/01/24 03/06/24 Yes Arber Wiemers, Janalyn Me, MD  ibuprofen  (ADVIL ) 800 MG tablet Take 1 tablet (800 mg total) by mouth 3 (three) times daily. Patient not taking: Reported on 12/30/2019 08/13/19   Idol, Julie, PA-C  naproxen  (NAPROSYN ) 500 MG tablet Take 1 tablet (500 mg total) by mouth 2 (two) times daily as needed. 12/13/19   Earma Gloss, MD      Allergies    Patient has no known allergies.    Review of Systems   Review of Systems  Physical Exam Updated Vital Signs BP 122/69   Pulse 73   Temp 98.2 F (36.8 C)   Resp 14   SpO2 99%  Physical Exam Constitutional:      General: He is not in acute distress. HENT:     Head: Normocephalic and atraumatic.  Eyes:     Conjunctiva/sclera: Conjunctivae normal.     Pupils: Pupils are equal, round, and reactive to light.  Cardiovascular:     Rate and Rhythm: Normal rate and regular rhythm.  Pulmonary:     Effort: Pulmonary effort is normal. No respiratory distress.  Skin:    General: Skin is warm and dry.  Neurological:     General: No focal deficit present.     Mental Status: He is alert. Mental status is at baseline.     Comments: Paresthesia ulnar distribution left 4th and 5th finger No weakness of grip strength, finger abduction or adduction, wrist strength  Psychiatric:        Mood and Affect: Mood normal.        Behavior: Behavior  normal.     ED Results / Procedures / Treatments   Labs (all labs ordered are listed, but only abnormal results are displayed) Labs Reviewed - No data to display  EKG None  Radiology No results found.  Procedures Procedures    Medications Ordered in ED Medications - No data to display  ED Course/ Medical Decision Making/ A&P                                 Medical Decision Making  Suspected ulnar nerve paresthesias, may be peripheral entrapment or due to repetitive movements at work.  No motor deficits; doubt CNS cause of symptoms; no indication for emergent neuro imaging.  We'll try wrist brace, 5 days of steroids, continue NSAIDS at home, can follow with neurology if it's persistent beyond 2-3 weeks.        Final Clinical Impression(s) / ED Diagnoses Final diagnoses:  Ulnar neuropathy of left upper extremity    Rx / DC Orders ED Discharge Orders          Ordered    predniSONE (DELTASONE) 10 MG tablet  Daily with breakfast        03/01/24 1326  Arvilla Birmingham, MD 03/01/24 [phone]

## 2024-03-01 NOTE — ED Triage Notes (Signed)
  Pt here with left arm pain for 1 week and numbness to some fingers. Motrin  not helping much at home

## 2024-03-01 NOTE — Discharge Instructions (Addendum)
 You may have a nerve pinched in your arm or wrist, called the ulnar nerve.  You should wear the wrist brace at all times (except when showering) for the next 2 weeks.  You can take steroids the next few days as prescribed.
